# Patient Record
Sex: Male | Born: 2005 | Race: White | Hispanic: No | Marital: Single | Smoking: Never smoker
Health system: Southern US, Community
[De-identification: ages and names within clinical notes are randomized; demographics above are authoritative.]

## PROBLEM LIST (undated history)

## (undated) DIAGNOSIS — R480 Dyslexia and alexia: Secondary | ICD-10-CM

## (undated) DIAGNOSIS — T148XXA Other injury of unspecified body region, initial encounter: Secondary | ICD-10-CM

## (undated) DIAGNOSIS — Z8489 Family history of other specified conditions: Secondary | ICD-10-CM

## (undated) HISTORY — PX: TYMPANOSTOMY TUBE PLACEMENT: SHX32

## (undated) HISTORY — PX: TONSILLECTOMY AND ADENOIDECTOMY: SHX28

---
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
## 2024-01-07 DIAGNOSIS — F419 Anxiety disorder, unspecified: Secondary | ICD-10-CM | POA: Diagnosis not present

## 2024-01-07 DIAGNOSIS — Z79899 Other long term (current) drug therapy: Secondary | ICD-10-CM | POA: Diagnosis not present

## 2024-01-07 DIAGNOSIS — F900 Attention-deficit hyperactivity disorder, predominantly inattentive type: Secondary | ICD-10-CM | POA: Diagnosis not present

## 2024-01-07 DIAGNOSIS — Z5181 Encounter for therapeutic drug level monitoring: Secondary | ICD-10-CM | POA: Diagnosis not present
